# Patient Record
Sex: Female | Born: 2000 | Race: Black or African American | Hispanic: No | Marital: Single | Smoking: Never smoker
Health system: Southern US, Community
[De-identification: ages and names within clinical notes are randomized; demographics above are authoritative.]

---
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
## 2023-05-19 ENCOUNTER — Other Ambulatory Visit (HOSPITAL_COMMUNITY): Payer: Self-pay
